# Patient Record
Sex: Male | Born: 1965 | Race: White | Hispanic: No | Smoking: Current some day smoker
Health system: Southern US, Community
[De-identification: ages and names within clinical notes are randomized; demographics above are authoritative.]

## PROBLEM LIST (undated history)

## (undated) DIAGNOSIS — E7800 Pure hypercholesterolemia, unspecified: Secondary | ICD-10-CM

## (undated) DIAGNOSIS — I10 Essential (primary) hypertension: Secondary | ICD-10-CM

---

## 2003-12-08 ENCOUNTER — Emergency Department (HOSPITAL_COMMUNITY): Admission: EM | Admit: 2003-12-08 | Discharge: 2003-12-08 | Payer: Self-pay | Admitting: Emergency Medicine

## 2003-12-09 ENCOUNTER — Emergency Department (HOSPITAL_COMMUNITY): Admission: EM | Admit: 2003-12-09 | Discharge: 2003-12-09 | Payer: Self-pay | Admitting: Emergency Medicine

## 2004-03-06 ENCOUNTER — Emergency Department (HOSPITAL_COMMUNITY): Admission: EM | Admit: 2004-03-06 | Discharge: 2004-03-06 | Payer: Self-pay | Admitting: Emergency Medicine

## 2018-08-14 ENCOUNTER — Encounter (HOSPITAL_BASED_OUTPATIENT_CLINIC_OR_DEPARTMENT_OTHER): Payer: Self-pay | Admitting: Emergency Medicine

## 2018-08-14 ENCOUNTER — Emergency Department (HOSPITAL_BASED_OUTPATIENT_CLINIC_OR_DEPARTMENT_OTHER)
Admission: EM | Admit: 2018-08-14 | Discharge: 2018-08-14 | Disposition: A | Discharge status: Home / Self Care | Payer: Worker's Compensation | Attending: Emergency Medicine | Admitting: Emergency Medicine

## 2018-08-14 ENCOUNTER — Other Ambulatory Visit: Payer: Self-pay

## 2018-08-14 ENCOUNTER — Emergency Department (HOSPITAL_BASED_OUTPATIENT_CLINIC_OR_DEPARTMENT_OTHER): Payer: Worker's Compensation

## 2018-08-14 DIAGNOSIS — Y9389 Activity, other specified: Secondary | ICD-10-CM | POA: Diagnosis not present

## 2018-08-14 DIAGNOSIS — Y990 Civilian activity done for income or pay: Secondary | ICD-10-CM | POA: Diagnosis not present

## 2018-08-14 DIAGNOSIS — W458XXA Other foreign body or object entering through skin, initial encounter: Secondary | ICD-10-CM | POA: Diagnosis not present

## 2018-08-14 DIAGNOSIS — S60352A Superficial foreign body of left thumb, initial encounter: Secondary | ICD-10-CM | POA: Insufficient documentation

## 2018-08-14 DIAGNOSIS — I10 Essential (primary) hypertension: Secondary | ICD-10-CM | POA: Insufficient documentation

## 2018-08-14 DIAGNOSIS — F17210 Nicotine dependence, cigarettes, uncomplicated: Secondary | ICD-10-CM | POA: Insufficient documentation

## 2018-08-14 DIAGNOSIS — Y9289 Other specified places as the place of occurrence of the external cause: Secondary | ICD-10-CM | POA: Diagnosis not present

## 2018-08-14 HISTORY — DX: Essential (primary) hypertension: I10

## 2018-08-14 HISTORY — DX: Pure hypercholesterolemia, unspecified: E78.00

## 2018-08-14 MED ORDER — SULFAMETHOXAZOLE-TRIMETHOPRIM 800-160 MG PO TABS: 1.0000 | ORAL_TABLET | Freq: Two times a day (BID) | ORAL | 0 refills | Status: AC

## 2018-08-14 MED ORDER — SULFAMETHOXAZOLE-TRIMETHOPRIM 800-160 MG PO TABS
1.0000 | ORAL_TABLET | Freq: Once | ORAL | Status: AC
  Administered 2018-08-14: 1 via ORAL
  Filled 2018-08-14: qty 1

## 2018-08-14 MED ORDER — BUPIVACAINE HCL (PF) 0.5 % IJ SOLN
10.0000 mL | Freq: Once | INTRAMUSCULAR | Status: AC
  Administered 2018-08-14: 10 mL
  Filled 2018-08-14: qty 10

## 2018-08-14 MED ORDER — BACITRACIN-NEOMYCIN-POLYMYXIN 400-5-5000 EX OINT
TOPICAL_OINTMENT | Freq: Once | CUTANEOUS | Status: AC
  Administered 2018-08-14: 12:00:00 via TOPICAL
  Filled 2018-08-14: qty 1

## 2018-08-14 NOTE — ED Triage Notes (Signed)
Electric staple gun staple through left thumb from nail to pad of thumb.  WC injury.  Needs drug screen.

## 2018-08-14 NOTE — ED Notes (Signed)
Suture cart at bedside 

## 2018-08-14 NOTE — ED Notes (Signed)
Patient transported to X-ray 

## 2018-08-14 NOTE — ED Provider Notes (Signed)
[facility] EMERGENCY DEPARTMENT Provider Note   CSN: [id number] Arrival date & time: 08/14/18  1035    History   Chief Complaint Chief Complaint  Patient presents with  . Hand Injury    HPI Danny Harmon is a 53 y.o. male.     Pt presents to the ED today with a staple in his left thumb.  Pt sustained the injury while at work while [employer].  No other injury.  He believes his tetanus is UTD.     Past Medical History:  Diagnosis Date  . High cholesterol   . Hypertension     There are no active problems to display for this patient.   Past Surgical History:  Procedure Laterality Date  . FEMUR FRACTURE SURGERY    . FINGER AMPUTATION    . KNEE ARTHROSCOPY WITH ANTERIOR CRUCIATE LIGAMENT (ACL) REPAIR          Home Medications    Prior to Admission medications   Medication Sig Start Date End Date Taking? Authorizing Provider  aspirin EC 81 MG tablet Take by mouth.    [provider]  lisinopril (ZESTRIL) 20 MG tablet Take 20 mg by mouth daily. 05/27/18   [provider]  Multiple Vitamin (MULTIVITAMIN) capsule Take by mouth.    [provider]  pravastatin (PRAVACHOL) 40 MG tablet Take 40 mg by mouth daily. 07/17/18   [provider]  sulfamethoxazole-trimethoprim (BACTRIM DS) 800-160 MG tablet Take 1 tablet by mouth 2 (two) times daily for 7 days. 08/14/18 08/21/18  Jacalyn LefevreHaviland, Giovan Pinsky, MD    Family History No family history on file.  Social History Social History   Tobacco Use  . Smoking status: Current Some Day Smoker  . Smokeless tobacco: Never Used  Substance Use Topics  . Alcohol use: Yes    Comment: occ  . Drug use: Never     Allergies   Patient has no known allergies.   Review of Systems Review of Systems  Musculoskeletal:       FB left thumb  All other systems reviewed and are negative.    Physical Exam Updated Vital Signs BP (!) 117/93 (BP Location: Right Arm)   Pulse 72   Temp  98.4 F (36.9 C) (Oral)   Resp 16   Ht 5\' 9"  (1.753 m)   Wt 83 kg   SpO2 96%   BMI 27.02 kg/m   Physical Exam Vitals signs and nursing note reviewed.  Constitutional:      Appearance: Normal appearance.  HENT:     Head: Normocephalic and atraumatic.     Right Ear: External ear normal.     Left Ear: External ear normal.     Nose: Nose normal.     Mouth/Throat:     Mouth: Mucous membranes are moist.     Pharynx: Oropharynx is clear.  Eyes:     Extraocular Movements: Extraocular movements intact.     Conjunctiva/sclera: Conjunctivae normal.     Pupils: Pupils are equal, round, and reactive to light.  Neck:     Musculoskeletal: Normal range of motion and neck supple.  Cardiovascular:     Rate and Rhythm: Normal rate and regular rhythm.     Pulses: Normal pulses.     Heart sounds: Normal heart sounds.  Pulmonary:     Effort: Pulmonary effort is normal.     Breath sounds: Normal breath sounds.  Abdominal:     General: Abdomen is flat. Bowel sounds are normal.  Palpations: Abdomen is soft.  Musculoskeletal:     Comments: FB left thumb.  See picture.  Skin:    General: Skin is warm.     Capillary Refill: Capillary refill takes less than 2 seconds.  Neurological:     General: No focal deficit present.     Mental Status: He is alert and oriented to person, place, and time.  Psychiatric:        Mood and Affect: Mood normal.        Behavior: Behavior normal.        ED Treatments / Results  Labs (all labs ordered are listed, but only abnormal results are displayed) Labs Reviewed - No data to display  EKG None  Radiology Dg Finger Thumb Left  Result Date: 08/14/2018 CLINICAL DATA:  Staple gun injury. EXAM: LEFT THUMB 2+V COMPARISON:  None. FINDINGS: Staple traversing the first distal phalanx. No acute fracture or dislocation. 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 Bone  mineralization is normal. IMPRESSION: 1. Staple traversing the first distal phalanx. 2. 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 Electronically Signed   By: Titus Dubin M.D.   On: 08/14/2018 11:06    Procedures .Foreign Body Removal  Date/Time: 08/14/2018 11:25 AM Performed by: Isla Pence, MD Authorized by: Isla Pence, MD  Consent: Verbal consent obtained. Consent given by: patient Patient understanding: patient states understanding of the procedure being performed Patient identity confirmed: verbally with patient Time out: Immediately prior to procedure a "time out" was called to verify the correct patient, procedure, equipment, support staff and site/side marked as required. Body area: skin General location: upper extremity Location details: left thumb Anesthesia: digital block  Anesthesia: Local Anesthetic: bupivacaine 0.5% without epinephrine Anesthetic total: 4 mL  Sedation: Patient sedated: no  Patient restrained: no Patient cooperative: yes Localization method: visualized Removal mechanism: hemostat and irrigation Dressing: antibiotic ointment and dressing applied Tendon involvement: none Depth: deep Complexity: simple 1 objects recovered. Objects recovered: staple Post-procedure assessment: foreign body removed Patient tolerance: patient tolerated the procedure well with no immediate complications   (including critical care time)  Medications Ordered in ED Medications  neomycin-bacitracin-polymyxin (NEOSPORIN) ointment packet (has no administration in time range)  sulfamethoxazole-trimethoprim (BACTRIM DS) 800-160 MG per tablet 1 tablet (has no administration in time range)  bupivacaine (MARCAINE) 0.5 % injection 10 mL (10 mLs Infiltration Given 08/14/18 1115)     Initial Impression / Assessment and Plan / ED Course  I have reviewed the triage vital signs and the nursing notes.  Pertinent labs & imaging  results that were available during my care of the patient were reviewed by me and considered in my medical decision making (see chart for details).       FB removed.  No tendon involvement.  Pt able to move his thumb without problems.  Pt is stable for d/c.  Return if worse.  Final Clinical Impressions(s) / ED Diagnoses   Final diagnoses:  Foreign body of left thumb, initial encounter    ED Discharge Orders         Ordered    sulfamethoxazole-trimethoprim (BACTRIM DS) 800-160 MG tablet  2 times daily     08/14/18 1127           Isla Pence, MD 08/14/18 1128

## 2018-08-31 ENCOUNTER — Emergency Department (HOSPITAL_BASED_OUTPATIENT_CLINIC_OR_DEPARTMENT_OTHER)
Admission: EM | Admit: 2018-08-31 | Discharge: 2018-08-31 | Disposition: A | Discharge status: Home / Self Care | Payer: Worker's Compensation | Attending: Emergency Medicine | Admitting: Emergency Medicine

## 2018-08-31 ENCOUNTER — Emergency Department (HOSPITAL_BASED_OUTPATIENT_CLINIC_OR_DEPARTMENT_OTHER): Payer: Worker's Compensation

## 2018-08-31 ENCOUNTER — Other Ambulatory Visit: Payer: Self-pay

## 2018-08-31 ENCOUNTER — Encounter (HOSPITAL_BASED_OUTPATIENT_CLINIC_OR_DEPARTMENT_OTHER): Payer: Self-pay | Admitting: Emergency Medicine

## 2018-08-31 DIAGNOSIS — W458XXA Other foreign body or object entering through skin, initial encounter: Secondary | ICD-10-CM | POA: Diagnosis not present

## 2018-08-31 DIAGNOSIS — Z7982 Long term (current) use of aspirin: Secondary | ICD-10-CM | POA: Insufficient documentation

## 2018-08-31 DIAGNOSIS — Y990 Civilian activity done for income or pay: Secondary | ICD-10-CM | POA: Diagnosis not present

## 2018-08-31 DIAGNOSIS — Y9389 Activity, other specified: Secondary | ICD-10-CM | POA: Diagnosis not present

## 2018-08-31 DIAGNOSIS — S60552A Superficial foreign body of left hand, initial encounter: Secondary | ICD-10-CM

## 2018-08-31 DIAGNOSIS — F17200 Nicotine dependence, unspecified, uncomplicated: Secondary | ICD-10-CM | POA: Diagnosis not present

## 2018-08-31 DIAGNOSIS — Z23 Encounter for immunization: Secondary | ICD-10-CM | POA: Diagnosis not present

## 2018-08-31 DIAGNOSIS — Y929 Unspecified place or not applicable: Secondary | ICD-10-CM | POA: Insufficient documentation

## 2018-08-31 DIAGNOSIS — Z79899 Other long term (current) drug therapy: Secondary | ICD-10-CM | POA: Diagnosis not present

## 2018-08-31 DIAGNOSIS — I10 Essential (primary) hypertension: Secondary | ICD-10-CM | POA: Insufficient documentation

## 2018-08-31 MED ORDER — TETANUS-DIPHTH-ACELL PERTUSSIS 5-2.5-18.5 LF-MCG/0.5 IM SUSP
0.5000 mL | Freq: Once | INTRAMUSCULAR | Status: AC
  Administered 2018-08-31: 12:00:00 0.5 mL via INTRAMUSCULAR
  Filled 2018-08-31: qty 0.5

## 2018-08-31 MED ORDER — DOXYCYCLINE HYCLATE 100 MG PO TABS
100.0000 mg | ORAL_TABLET | Freq: Once | ORAL | Status: AC
  Administered 2018-08-31: 12:00:00 100 mg via ORAL
  Filled 2018-08-31: qty 1

## 2018-08-31 MED ORDER — BACITRACIN ZINC 500 UNIT/GM EX OINT
TOPICAL_OINTMENT | Freq: Once | CUTANEOUS | Status: AC
  Administered 2018-08-31: 12:00:00 via TOPICAL
  Filled 2018-08-31: qty 28.35

## 2018-08-31 MED ORDER — LIDOCAINE-EPINEPHRINE (PF) 2 %-1:200000 IJ SOLN
10.0000 mL | Freq: Once | INTRAMUSCULAR | Status: AC
  Administered 2018-08-31: 10 mL
  Filled 2018-08-31 (×2): qty 10

## 2018-08-31 MED ORDER — DOXYCYCLINE HYCLATE 100 MG PO CAPS: 100.0000 mg | ORAL_CAPSULE | Freq: Two times a day (BID) | ORAL | 0 refills | Status: AC

## 2018-08-31 NOTE — ED Notes (Signed)
UDS completed 

## 2018-08-31 NOTE — Discharge Instructions (Signed)
Keep the area clean with soap and water.  Apply the bacitracin ointment twice per day.  If you notice signs of an infection such as redness, increasing pain, drainage, or any other new/concerning symptoms then return to the ER for evaluation.

## 2018-08-31 NOTE — ED Triage Notes (Signed)
Pt has a staple from an electric staple gun to L hand.

## 2018-08-31 NOTE — ED Provider Notes (Signed)
[facility] EMERGENCY DEPARTMENT Provider Note   CSN: [id number] Arrival date & time: 08/31/18  1016    History   Chief Complaint Chief Complaint  Patient presents with  . Foreign Body in Skin    HPI Danny Harmon is a 53 y.o. male.     HPI  53 year old male presents with staple into his left hand.  Accidentally injected himself with electric staple gun.  This occurred about 1 hour ago.  Pain is about a 6 out of 10.  No weakness or numbness though it is painful to move his thumb.  He is unsure of his last tetanus immunization but thinks he might of gotten at when he had hernia repair in 2016.  Past Medical History:  Diagnosis Date  . High cholesterol   . Hypertension     There are no active problems to display for this patient.   Past Surgical History:  Procedure Laterality Date  . FEMUR FRACTURE SURGERY    . FINGER AMPUTATION    . KNEE ARTHROSCOPY WITH ANTERIOR CRUCIATE LIGAMENT (ACL) REPAIR          Home Medications    Prior to Admission medications   Medication Sig Start Date End Date Taking? Authorizing Provider  aspirin EC 81 MG tablet Take by mouth.    [provider]  doxycycline (VIBRAMYCIN) 100 MG capsule Take 1 capsule (100 mg total) by mouth 2 (two) times daily for 3 days. One po bid x 7 days 08/31/18 09/03/18  Sherwood Gambler, MD  lisinopril (ZESTRIL) 20 MG tablet Take 20 mg by mouth daily. 05/27/18   [provider]  Multiple Vitamin (MULTIVITAMIN) capsule Take by mouth.    [provider]  pravastatin (PRAVACHOL) 40 MG tablet Take 40 mg by mouth daily. 07/17/18   [provider]    Family History No family history on file.  Social History Social History   Tobacco Use  . Smoking status: Current Some Day Smoker  . Smokeless tobacco: Never Used  Substance Use Topics  . Alcohol use: Yes    Comment: occ  . Drug use: Never     Allergies   Patient has no known allergies.   Review of Systems  Review of Systems  Musculoskeletal: Positive for arthralgias.  Neurological: Negative for weakness and numbness.     Physical Exam Updated Vital Signs BP 125/89 (BP Location: Right Arm)   Pulse 83   Temp 97.9 F (36.6 C) (Oral)   Resp 16   Ht 5\' 9"  (1.753 m)   Wt 86.2 kg   SpO2 99%   BMI 28.06 kg/m   Physical Exam Vitals signs and nursing note reviewed.  Constitutional:      Appearance: He is well-developed.  HENT:     Head: Normocephalic and atraumatic.     Right Ear: External ear normal.     Left Ear: External ear normal.     Nose: Nose normal.  Eyes:     General:        Right eye: No discharge.        Left eye: No discharge.  Neck:     Musculoskeletal: Neck supple.  Cardiovascular:     Rate and Rhythm: Normal rate and regular rhythm.     Pulses:          Radial pulses are 2+ on the left side.  Pulmonary:     Effort: Pulmonary effort is normal.  Abdominal:     General: There is  no distension.  Musculoskeletal:     Left wrist: He exhibits normal range of motion.     Left hand: He exhibits tenderness.       Hands:     Comments: Normal ROM of fingers/wrist except thumb, limited due to pain  Skin:    General: Skin is warm and dry.  Neurological:     Mental Status: He is alert.  Psychiatric:        Mood and Affect: Mood is not anxious.        ED Treatments / Results  Labs (all labs ordered are listed, but only abnormal results are displayed) Labs Reviewed - No data to display  EKG None  Radiology Dg Hand Complete Left  Result Date: 08/31/2018 CLINICAL DATA:  Left hand injury with a staple. EXAM: LEFT HAND - COMPLETE 3+ VIEW COMPARISON:  Left thumb radiographs August 14, 2018 FINDINGS: There is no evidence of fracture or dislocation. Subchondral cysts are noted within the bones of the first carpal row. Metallic staple within the palmar soft tissues adjacent to the first carpometacarpal joint. Soft tissue swelling. IMPRESSION: No acute fracture or  dislocation identified about the left hand. Foreign body within the palmar soft tissues adjacent to the first carpometacarpal joint. Electronically Signed   By: Ted Mcalpineobrinka  Dimitrova M.D.   On: 08/31/2018 11:37    Procedures .Foreign Body Removal  Date/Time: 08/31/2018 12:18 PM Performed by: Pricilla LovelessGoldston, Dorann Davidson, MD Authorized by: Pricilla LovelessGoldston, Alon Mazor, MD  Consent: Verbal consent obtained. Risks and benefits: risks, benefits and alternatives were discussed Consent given by: patient Patient identity confirmed: verbally with patient Time out: Immediately prior to procedure a "time out" was called to verify the correct patient, procedure, equipment, support staff and site/side marked as required. Body area: skin General location: upper extremity Location details: left hand Anesthesia: local infiltration  Anesthesia: Local Anesthetic: lidocaine 2% with epinephrine  Sedation: Patient sedated: no  Patient restrained: no Patient cooperative: yes Localization method: visualized Removal mechanism: hemostat Dressing: antibiotic ointment and dressing applied Depth: deep Complexity: simple 1 objects recovered. Objects recovered: staple Post-procedure assessment: foreign body removed Patient tolerance: patient tolerated the procedure well with no immediate complications   (including critical care time)  Medications Ordered in ED Medications  lidocaine-EPINEPHrine (XYLOCAINE W/EPI) 2 %-1:200000 (PF) injection 10 mL (10 mLs Infiltration Given by Other 08/31/18 1155)  Tdap (BOOSTRIX) injection 0.5 mL (0.5 mLs Intramuscular Given 08/31/18 1227)  doxycycline (VIBRA-TABS) tablet 100 mg (100 mg Oral Given 08/31/18 1227)  bacitracin ointment ( Topical Given 08/31/18 1226)     Initial Impression / Assessment and Plan / ED Course  I have reviewed the triage vital signs and the nursing notes.  Pertinent labs & imaging results that were available during my care of the patient were reviewed by me and  considered in my medical decision making (see chart for details).        After local anesthetic, nail was able to be removed intact very easily with hemostats.  It slid right out.  He is now able to move his thumb easily without difficulty.  There does not appear to be any acute neuro vascular compromise.  He agrees to get his Tdap updated.  I discussed the lack of evidence for prophylactic antibiotics by think it is reasonable in this case.  We discussed signs and symptoms to return for for infection.  Final Clinical Impressions(s) / ED Diagnoses   Final diagnoses:  Foreign body of left hand, initial encounter    ED  Discharge Orders         Ordered    doxycycline (VIBRAMYCIN) 100 MG capsule  2 times daily     08/31/18 1235           Pricilla LovelessGoldston, Myana Schlup, MD 08/31/18 1236

## 2020-09-29 IMAGING — CR LEFT HAND - COMPLETE 3+ VIEW
3 series · 3 of 3 positions shown · non-contrast
Comparison: Left thumb radiographs August 14, 2018

CLINICAL DATA: Left hand injury with a staple.

EXAM:
LEFT HAND - COMPLETE 3+ VIEW

[x hand pa left]
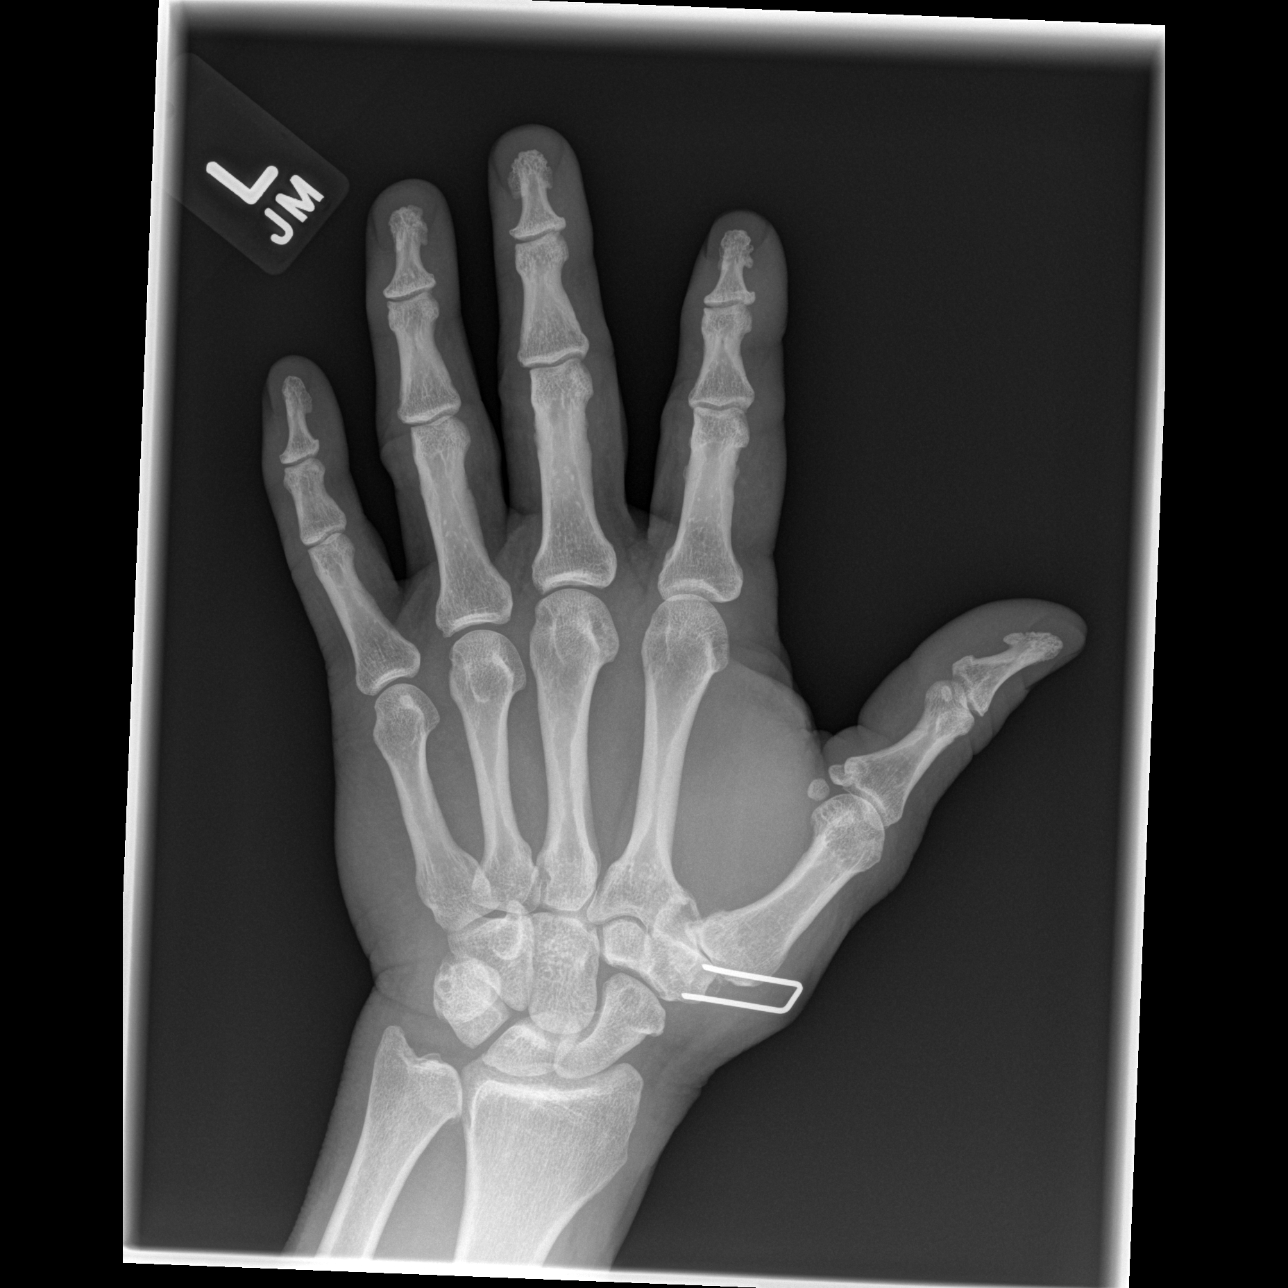

[x hand oblique left]
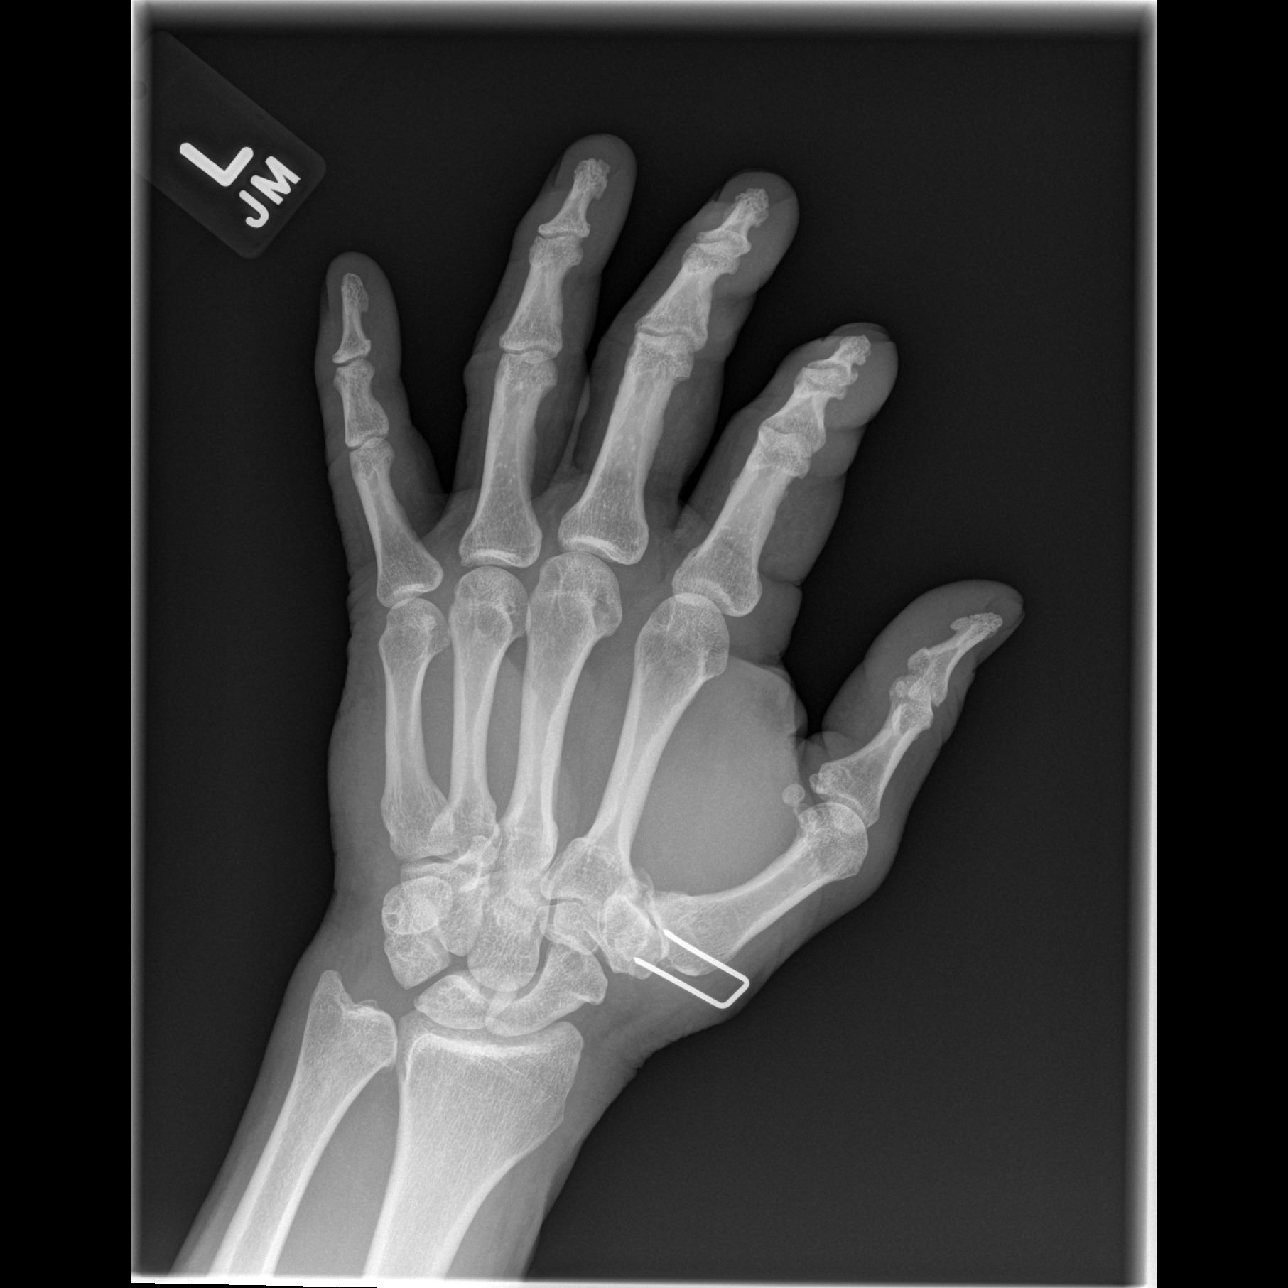

[x hand lat left]
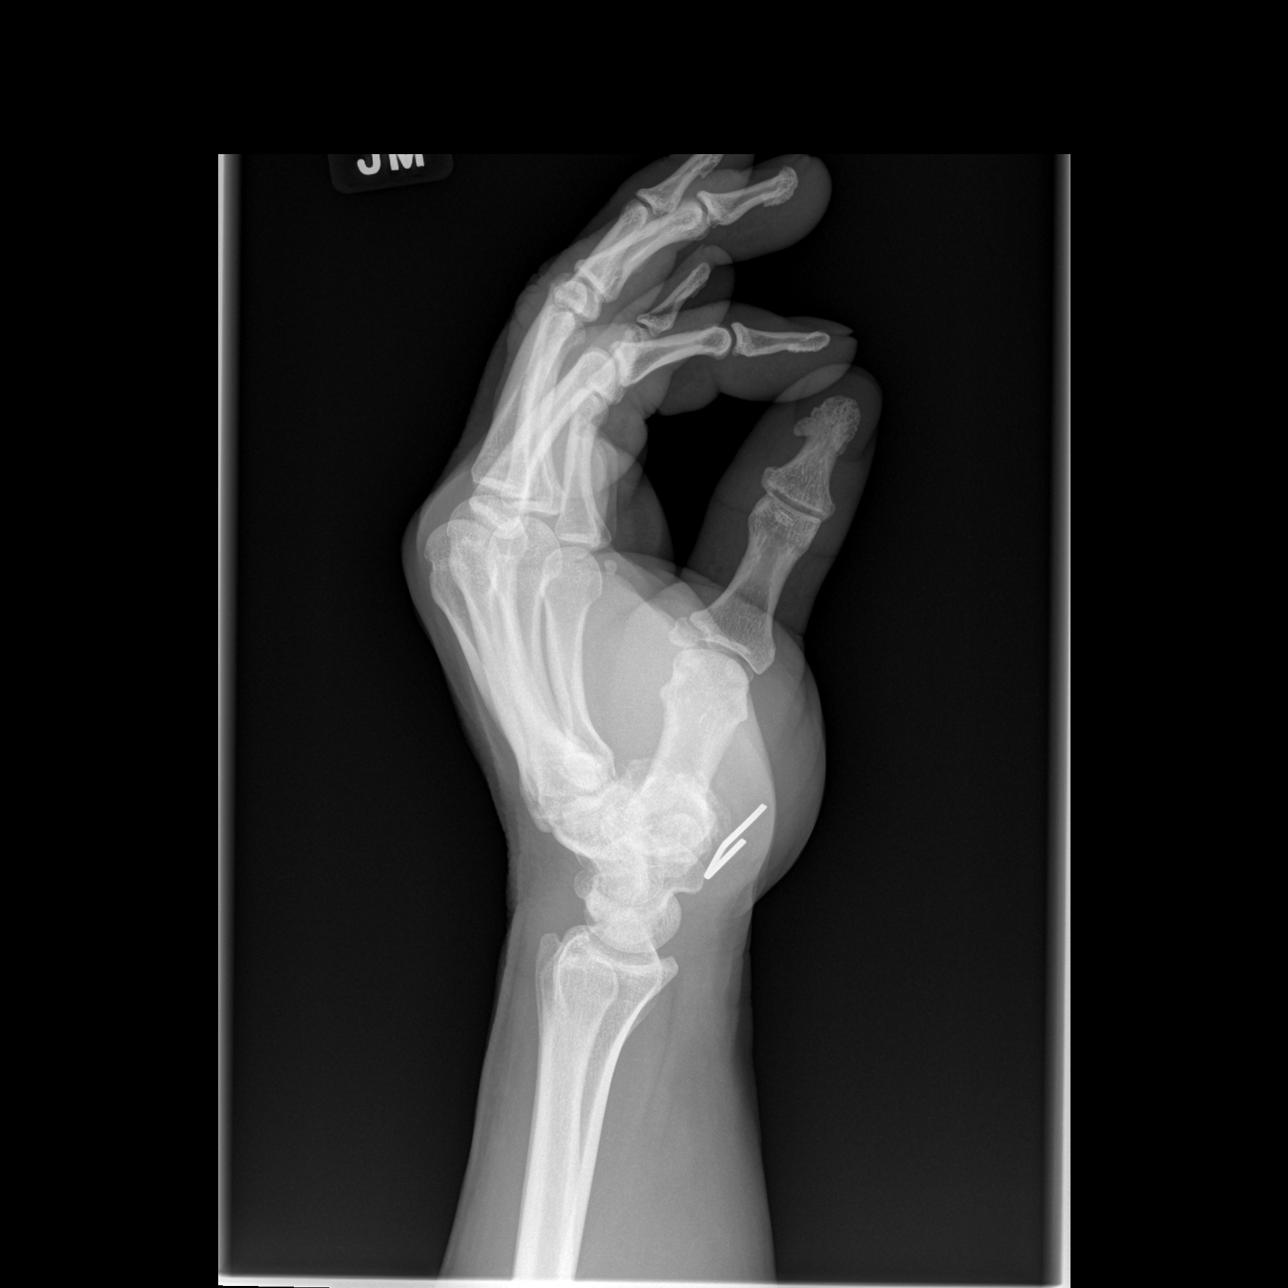

[3 of 3 positions shown; findings below may reference images not displayed]

FINDINGS: There is no evidence of fracture or dislocation. Subchondral cysts
are noted within the bones of the first carpal row.

Metallic staple within the palmar soft tissues adjacent to the first
carpometacarpal joint. Soft tissue swelling.
IMPRESSION: No acute fracture or dislocation identified about the left hand.

Foreign body within the palmar soft tissues adjacent to the first
carpometacarpal joint.
